# Patient Record
Sex: Female | Born: 1995 | Race: White | Hispanic: No | Marital: Single | Smoking: Current every day smoker
Health system: Southern US, Community
[De-identification: ages and names within clinical notes are randomized; demographics above are authoritative.]

---
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
## 2017-01-29 ENCOUNTER — Encounter (HOSPITAL_COMMUNITY): Payer: Self-pay

## 2017-01-29 DIAGNOSIS — R109 Unspecified abdominal pain: Secondary | ICD-10-CM | POA: Diagnosis present

## 2017-01-29 DIAGNOSIS — Z7722 Contact with and (suspected) exposure to environmental tobacco smoke (acute) (chronic): Secondary | ICD-10-CM | POA: Insufficient documentation

## 2017-01-29 DIAGNOSIS — R1031 Right lower quadrant pain: Secondary | ICD-10-CM | POA: Diagnosis not present

## 2017-01-29 LAB — CBC
HEMATOCRIT: 42.5 % (ref 36.0–46.0)
HEMOGLOBIN: 14.6 g/dL (ref 12.0–15.0)
MCH: 32.2 pg (ref 26.0–34.0)
MCHC: 34.4 g/dL (ref 30.0–36.0)
MCV: 93.8 fL (ref 78.0–100.0)
Platelets: 388 10*3/uL (ref 150–400)
RBC: 4.53 MIL/uL (ref 3.87–5.11)
RDW: 12.5 % (ref 11.5–15.5)
WBC: 11.3 10*3/uL — ABNORMAL HIGH (ref 4.0–10.5)

## 2017-01-29 LAB — COMPREHENSIVE METABOLIC PANEL
ALBUMIN: 4 g/dL (ref 3.5–5.0)
ALT: 30 U/L (ref 14–54)
AST: 28 U/L (ref 15–41)
Alkaline Phosphatase: 69 U/L (ref 38–126)
Anion gap: 8 (ref 5–15)
BUN: 7 mg/dL (ref 6–20)
CO2: 21 mmol/L — ABNORMAL LOW (ref 22–32)
Calcium: 8.9 mg/dL (ref 8.9–10.3)
Chloride: 105 mmol/L (ref 101–111)
Creatinine, Ser: 0.69 mg/dL (ref 0.44–1.00)
GFR calc Af Amer: >60 mL/min (ref >60)
GFR calc non Af Amer: >60 mL/min (ref >60)
Glucose, Bld: 96 mg/dL (ref 65–99)
POTASSIUM: 3.5 mmol/L (ref 3.5–5.1)
Sodium: 134 mmol/L — ABNORMAL LOW (ref 135–145)
Total Bilirubin: 0.6 mg/dL (ref 0.3–1.2)
Total Protein: 6.9 g/dL (ref 6.5–8.1)

## 2017-01-29 LAB — POC URINE PREG, ED: Preg Test, Ur: NEGATIVE

## 2017-01-29 LAB — URINALYSIS, ROUTINE W REFLEX MICROSCOPIC
BILIRUBIN URINE: NEGATIVE
GLUCOSE, UA: NEGATIVE mg/dL
HGB URINE DIPSTICK: NEGATIVE
KETONES UR: NEGATIVE mg/dL
Leukocytes, UA: NEGATIVE
Nitrite: NEGATIVE
PH: 7 (ref 5.0–8.0)
Protein, ur: NEGATIVE mg/dL
SPECIFIC GRAVITY, URINE: 1.01 (ref 1.005–1.030)

## 2017-01-29 LAB — LIPASE, BLOOD: Lipase: 30 U/L (ref 11–51)

## 2017-01-29 NOTE — ED Triage Notes (Signed)
Pt complaining of R lower abdominal pain x 1 day. Pt denies any urinary symptoms. Pt denies any vaginal bleeding or discharge. Pt denies any N/V/D. Pt states LBM today. Pt complaining of constipation.

## 2017-01-30 ENCOUNTER — Emergency Department (HOSPITAL_COMMUNITY)
Admission: EM | Admit: 2017-01-30 | Discharge: 2017-01-30 | Disposition: A | Discharge status: Home / Self Care | Payer: Medicaid Other | Attending: Emergency Medicine | Admitting: Emergency Medicine

## 2017-01-30 DIAGNOSIS — R1031 Right lower quadrant pain: Secondary | ICD-10-CM

## 2017-01-30 NOTE — ED Notes (Signed)
C/o a headache 

## 2017-01-30 NOTE — ED Provider Notes (Signed)
By signing my name below, I, Clarisse GougeXavier Herndon, attest that this documentation has been prepared under the direction and in the presence of Royal Beirne, Layla MawKristen N, DO. Electronically signed, Clarisse GougeXavier Herndon, ED Scribe. 01/30/17. 2:17 AM.   TIME SEEN: 2:13 AM   CHIEF COMPLAINT: Abdominal Pain  HPI:  Erika Clarke is an otherwise healthy 21 y.o. female presenting to the Emergency Department concerning RLQ pain onset within the last 24 hours. Associated dizziness and headache, chills, nausea, noted; pt states all symptoms but headache have subsided. Triage reports constipation. Last BM reportedly yesterday. She describes all over throbbing pain with headache. Has had similar headaches before. No sudden onset, thunderclap headache. No medications PTA. LNMP "2 weeks ago". H/o ovarian cysts noted; pt states he pain feels different from that. No h/o abdominal surgeries noted. Pt sexually active; no h/o pregnancies or STD's. No dysuria, hematuria, urgency, urinary frequency, vaginal bleeding or discharge, vomiting or diarrhea noted. No other complaints at this time.   ROS: See HPI Constitutional: no fever  Eyes: no drainage  ENT: no runny nose   Cardiovascular:  no chest pain  Resp: no SOB  GI: no vomiting GU: no dysuria Integumentary: no rash  Allergy: no hives  Musculoskeletal: no leg swelling  Neurological: no slurred speech ROS otherwise negative  PAST MEDICAL HISTORY/PAST SURGICAL HISTORY:  History reviewed. No pertinent past medical history.  MEDICATIONS:  Prior to Admission medications   Medication Sig Start Date End Date Taking? Authorizing Provider  amoxicillin (AMOXIL) 400 MG/5ML suspension Take 6.3 mLs (500 mg total) by mouth 2 (two) times daily. 04/28/15   Ozella RocksMerrell, David J, MD    ALLERGIES:  No Known Allergies  SOCIAL HISTORY:  Social History  Substance Use Topics  . Smoking status: Passive Smoke Exposure - Never Smoker  . Smokeless tobacco: Never Used  . Alcohol use No     FAMILY HISTORY: History reviewed. No pertinent family history.  EXAM: BP 116/74   Pulse 98   Temp 98.5 F (36.9 C) (Oral)   Resp 18   LMP 01/15/2017 (Approximate)   SpO2 100%  CONSTITUTIONAL: Alert and oriented and responds appropriately to questions. Well-appearing; well-nourished HEAD: Normocephalic EYES: Conjunctivae clear, pupils appear equal, EOMI ENT: normal nose; moist mucous membranes NECK: Supple, no meningismus, no nuchal rigidity, no LAD  CARD: RRR; S1 and S2 appreciated; no murmurs, no clicks, no rubs, no gallops RESP: Normal chest excursion without splinting or tachypnea; breath sounds clear and equal bilaterally; no wheezes, no rhonchi, no rales, no hypoxia or respiratory distress, speaking full sentences ABD/GI: Normal bowel sounds; non-distended; soft, non-tender, no rebound, no guarding, no peritoneal signs, no hepatosplenomegaly BACK:  The back appears normal and is non-tender to palpation, there is no CVA tenderness EXT: Normal ROM in all joints; non-tender to palpation; no edema; normal capillary refill; no cyanosis, no calf tenderness or swelling    SKIN: Normal color for age and race; warm; no rash NEURO: Moves all extremities equally, normal sensation diffusely, cranial nerves II through XII intact, normal gait, normal speech PSYCH: The patient's mood and manner are appropriate. Grooming and personal hygiene are appropriate.  MEDICAL DECISION MAKING: Patient here with complaints of abdominal pain that has now completely resolved. She is actually no tenderness at McBurney's point, no tenderness in the pelvic area, negative Murphy sign. She states her pain and dizziness have resolved and all she is having is a mild headache at this time. Have offered Tylenol, ibuprofen which she declined stating she will take it  when she gets home. I do not think this is an intracranial hemorrhage or meningitis. Discussed with her that I doubt this is appendicitis, ovarian torsion,  PID, TOA or significant ovarian cyst given she is completely pain-free at this time. Her labs and urine are unremarkable and she is not pregnant. I do not feel she needs emergent imaging and she is comfortable with this plan. We did discuss if pain returns or she begins having fevers, vomiting and cannot stop, significant vaginal bleeding that she needs to return to the hospital. Patient and mother at bedside are comfortable with this plan. She has outpatient provider for follow-up.  At this time, I do not feel there is any life-threatening condition present. I have reviewed and discussed all results (EKG, imaging, lab, urine as appropriate) and exam findings with patient/family. I have reviewed nursing notes and appropriate previous records.  I feel the patient is safe to be discharged home without further emergent workup and can continue workup as an outpatient as needed. Discussed usual and customary return precautions. Patient/family verbalize understanding and are comfortable with this plan.  Outpatient follow-up has been provided if needed. All questions have been answered.   I personally performed the services described in this documentation, which was scribed in my presence. The recorded information has been reviewed and is accurate.      Puja Caffey, Layla Maw, DO 01/30/17 [phone]

## 2017-01-30 NOTE — Discharge Instructions (Signed)
You may alternate Tylenol 1000 mg every 6 hours as needed for pain and Ibuprofen 800 mg every 8 hours as needed for pain.  Please take Ibuprofen with food. ° °

## 2017-03-17 ENCOUNTER — Encounter (HOSPITAL_COMMUNITY): Payer: Self-pay | Admitting: Emergency Medicine

## 2017-03-17 ENCOUNTER — Emergency Department (HOSPITAL_COMMUNITY): Payer: Medicaid Other

## 2017-03-17 DIAGNOSIS — S52125A Nondisplaced fracture of head of left radius, initial encounter for closed fracture: Secondary | ICD-10-CM | POA: Diagnosis not present

## 2017-03-17 DIAGNOSIS — Y999 Unspecified external cause status: Secondary | ICD-10-CM | POA: Diagnosis not present

## 2017-03-17 DIAGNOSIS — Y929 Unspecified place or not applicable: Secondary | ICD-10-CM | POA: Diagnosis not present

## 2017-03-17 DIAGNOSIS — S52135A Nondisplaced fracture of neck of left radius, initial encounter for closed fracture: Secondary | ICD-10-CM | POA: Diagnosis not present

## 2017-03-17 DIAGNOSIS — F17210 Nicotine dependence, cigarettes, uncomplicated: Secondary | ICD-10-CM | POA: Insufficient documentation

## 2017-03-17 DIAGNOSIS — Y9351 Activity, roller skating (inline) and skateboarding: Secondary | ICD-10-CM | POA: Diagnosis not present

## 2017-03-17 DIAGNOSIS — S59802A Other specified injuries of left elbow, initial encounter: Secondary | ICD-10-CM | POA: Diagnosis present

## 2017-03-17 NOTE — ED Triage Notes (Signed)
Pt reports she was riding a skateboard, fell off landing on L elbow, mild swelling noted.

## 2017-03-18 ENCOUNTER — Emergency Department (HOSPITAL_COMMUNITY)
Admission: EM | Admit: 2017-03-18 | Discharge: 2017-03-18 | Disposition: A | Discharge status: Home / Self Care | Payer: Medicaid Other | Attending: Emergency Medicine | Admitting: Emergency Medicine

## 2017-03-18 DIAGNOSIS — S52125A Nondisplaced fracture of head of left radius, initial encounter for closed fracture: Secondary | ICD-10-CM

## 2017-03-18 DIAGNOSIS — S52135A Nondisplaced fracture of neck of left radius, initial encounter for closed fracture: Secondary | ICD-10-CM

## 2017-03-18 MED ORDER — OXYCODONE-ACETAMINOPHEN 5-325 MG PO TABS: 1.0000 | ORAL_TABLET | Freq: Once | ORAL | Status: DC

## 2017-03-18 MED ORDER — IBUPROFEN 100 MG/5ML PO SUSP
600.0000 mg | Freq: Once | ORAL | Status: AC
  Administered 2017-03-18: 600 mg via ORAL
  Filled 2017-03-18: qty 30

## 2017-03-18 MED ORDER — IBUPROFEN 100 MG/5ML PO SUSP: 400.0000 mg | Freq: Four times a day (QID) | ORAL | 0 refills | Status: DC | PRN

## 2017-03-18 MED ORDER — MORPHINE SULFATE (PF) 4 MG/ML IV SOLN
4.0000 mg | Freq: Once | INTRAVENOUS | Status: AC
  Administered 2017-03-18: 4 mg via INTRAMUSCULAR
  Filled 2017-03-18: qty 1

## 2017-03-18 NOTE — ED Provider Notes (Signed)
[facility]-EMERGENCY DEPT Provider Note   CSN: [id number] Arrival date & time: 03/17/17  2142     History   Chief Complaint No chief complaint on file.   HPI Erika Clarke is a 21 y.o. female.  HPI  21 y.o. female, presents to the Emergency Department today due to left elbow pain. Pt states she fell off skateboard PTA around 9pm. No head trauma or LOC. Landed on left elbow directly. Notes pain with ROM. Rates pain 10/10. Throbbing. No meds PTA. Able to move fingers. Denies numbness/tingling. No other symptoms noted.    History reviewed. No pertinent past medical history.  There are no active problems to display for this patient.   History reviewed. No pertinent surgical history.  OB History    No data available       Home Medications    Prior to Admission medications   Medication Sig Start Date End Date Taking? Authorizing Provider  amoxicillin (AMOXIL) 400 MG/5ML suspension Take 6.3 mLs (500 mg total) by mouth 2 (two) times daily. 04/28/15   Ozella Rocks, MD    Family History No family history on file.  Social History Social History  Substance Use Topics  . Smoking status: Current Every Day Smoker    Packs/day: 1.00  . Smokeless tobacco: Never Used  . Alcohol use No     Allergies   Salmon [fish allergy]   Review of Systems Review of Systems ROS reviewed and all are negative for acute change except as noted in the HPI.  Physical Exam Updated Vital Signs BP 128/87 (BP Location: Right Arm)   Pulse (!) 113   Temp 98.6 F (37 C) (Oral)   Resp 20   Ht 5\' 2"  (1.575 m)   Wt 74.8 kg (165 lb)   LMP 03/03/2017 (Approximate)   SpO2 100%   BMI 30.18 kg/m   Physical Exam  Constitutional: She is oriented to person, place, and time. Vital signs are normal. She appears well-developed and well-nourished.  HENT:  Head: Normocephalic.  Right Ear: Hearing normal.  Left Ear: Hearing normal.  Eyes: Pupils are equal, round, and reactive to light. Conjunctivae  and EOM are normal.  Cardiovascular: Regular rhythm.  Tachycardia present.   Pulmonary/Chest: Effort normal.  Musculoskeletal:  TTP around left elbow. Mild swelling. Limited ROM due to pain. Distal pulses appreciated. NVI.   Neurological: She is alert and oriented to person, place, and time.  Skin: Skin is warm and dry.  Psychiatric: She has a normal mood and affect. Her speech is normal and behavior is normal. Thought content normal.     ED Treatments / Results  Labs (all labs ordered are listed, but only abnormal results are displayed) Labs Reviewed - No data to display  EKG  EKG Interpretation None       Radiology Dg Elbow Complete Left  Result Date: 03/17/2017 CLINICAL DATA:  Elbow pain after fall from skateboard. EXAM: LEFT ELBOW - COMPLETE 3+ VIEW COMPARISON:  None. FINDINGS: There are acute, closed nondisplaced fractures of the radial head and neck with associated joint effusion. No joint dislocation is noted. No significant fracture displacement or depression. IMPRESSION: Acute, closed nondisplaced fractures of the radial head and neck with associated joint effusion. No significant depression or displacement is identified. Electronically Signed   By: Tollie Eth M.D.   On: 03/17/2017 22:53    Procedures .Splint Application Date/Time: 03/18/2017 12:25 AM Performed by: Audry Pili Authorized by: Audry Pili   Consent:    Consent obtained:  Verbal   Consent given by:  Patient   Risks discussed:  Discoloration, numbness, pain and swelling   Alternatives discussed:  No treatment Pre-procedure details:    Sensation:  Normal Procedure details:    Laterality:  Left   Location:  Elbow   Elbow:  L elbow   Splint type:  Long arm Post-procedure details:    Pain:  Improved   Sensation:  Normal   Patient tolerance of procedure:  Tolerated well, no immediate complications   (including critical care time)  Medications Ordered in ED Medications  morphine 4 MG/ML injection  4 mg (not administered)     Initial Impression / Assessment and Plan / ED Course  I have reviewed the triage vital signs and the nursing notes.  Pertinent labs & imaging results that were available during my care of the patient were reviewed by me and considered in my medical decision making (see chart for details).  Final Clinical Impressions(s) / ED Diagnoses   {I have reviewed and evaluated the relevant imaging studies.  {I have reviewed the relevant previous healthcare records.  {I obtained HPI from historian.   ED Course:  Assessment: Patient X-Ray shows radiad head/neck fracture. Non displaced. Closed.  Pt advised to follow up with orthopedics. Patient given long arm splint and sling while in ED. Cap refill intact. NVI post application. Conservative therapy recommended and discussed. Patient will be discharged home & is agreeable with above plan. Returns precautions discussed. Pt appears safe for discharge.  Disposition/Plan:  DC Home Additional Verbal discharge instructions given and discussed with patient.  Pt Instructed to f/u with ortho Hand in the next week for evaluation and treatment of symptoms. Return precautions given Pt acknowledges and agrees with plan  Supervising Physician Azalia Bilisampos, Kevin, MD  Final diagnoses:  Closed nondisplaced fracture of neck of left radius, initial encounter  Closed nondisplaced fracture of head of left radius, initial encounter    New Prescriptions New Prescriptions   No medications on file     Wilber BihariMohr, Haylyn Halberg, PA-C 03/18/17 Nolon Bussing0025    Campos, Kevin, MD 03/18/17 [phone]

## 2017-03-18 NOTE — Discharge Instructions (Signed)
Please read and follow all provided instructions.  Your diagnoses today include:  1. Closed nondisplaced fracture of neck of left radius, initial encounter   2. Closed nondisplaced fracture of head of left radius, initial encounter     Tests performed today include: Vital signs. See below for your results today.   Medications prescribed:  Take as prescribed   Home care instructions:  Follow any educational materials contained in this packet.  Follow-up instructions: Please follow-up with orthopedics for further evaluation of symptoms and treatment   Return instructions:  Please return to the Emergency Department if you do not get better, if you get worse, or new symptoms OR  - Fever (temperature greater than 101.53F)  - Bleeding that does not stop with holding pressure to the area    -Severe pain (please note that you may be more sore the day after your accident)  - Chest Pain  - Difficulty breathing  - Severe nausea or vomiting  - Inability to tolerate food and liquids  - Passing out  - Skin becoming red around your wounds  - Change in mental status (confusion or lethargy)  - New numbness or weakness    Please return if you have any other emergent concerns.  Additional Information:  Your vital signs today were: BP 128/87 (BP Location: Right Arm)    Pulse (!) 113    Temp 98.6 F (37 C) (Oral)    Resp 20    Ht 5\' 2"  (1.575 m)    Wt 74.8 kg (165 lb)    LMP 03/03/2017 (Approximate)    SpO2 100%    BMI 30.18 kg/m  If your blood pressure (BP) was elevated above 135/85 this visit, please have this repeated by your doctor within one month. ---------------

## 2017-03-23 DIAGNOSIS — X58XXXA Exposure to other specified factors, initial encounter: Secondary | ICD-10-CM | POA: Diagnosis not present

## 2017-03-23 DIAGNOSIS — Y9351 Activity, roller skating (inline) and skateboarding: Secondary | ICD-10-CM | POA: Insufficient documentation

## 2017-03-23 DIAGNOSIS — F17200 Nicotine dependence, unspecified, uncomplicated: Secondary | ICD-10-CM | POA: Diagnosis not present

## 2017-03-23 DIAGNOSIS — Y998 Other external cause status: Secondary | ICD-10-CM | POA: Diagnosis not present

## 2017-03-23 DIAGNOSIS — L03116 Cellulitis of left lower limb: Secondary | ICD-10-CM | POA: Diagnosis not present

## 2017-03-23 DIAGNOSIS — Y929 Unspecified place or not applicable: Secondary | ICD-10-CM | POA: Insufficient documentation

## 2017-03-23 DIAGNOSIS — S99922A Unspecified injury of left foot, initial encounter: Secondary | ICD-10-CM | POA: Diagnosis present

## 2017-03-23 NOTE — ED Triage Notes (Signed)
Pt c/o L foot pain/swelling/redness and drainage post injury 7 days ago. Red streak noted to dorsal foot.

## 2017-03-24 ENCOUNTER — Emergency Department (HOSPITAL_COMMUNITY): Payer: Medicaid Other

## 2017-03-24 ENCOUNTER — Encounter (HOSPITAL_COMMUNITY): Payer: Self-pay | Admitting: Emergency Medicine

## 2017-03-24 ENCOUNTER — Emergency Department (HOSPITAL_COMMUNITY)
Admission: EM | Admit: 2017-03-24 | Discharge: 2017-03-24 | Disposition: A | Discharge status: Home / Self Care | Payer: Medicaid Other | Attending: Emergency Medicine | Admitting: Emergency Medicine

## 2017-03-24 DIAGNOSIS — L03119 Cellulitis of unspecified part of limb: Secondary | ICD-10-CM

## 2017-03-24 LAB — COMPREHENSIVE METABOLIC PANEL
ALK PHOS: 85 U/L (ref 38–126)
ALT: 38 U/L (ref 14–54)
ANION GAP: 11 (ref 5–15)
AST: 36 U/L (ref 15–41)
Albumin: 4.1 g/dL (ref 3.5–5.0)
BILIRUBIN TOTAL: 0.7 mg/dL (ref 0.3–1.2)
BUN: 8 mg/dL (ref 6–20)
CALCIUM: 9.4 mg/dL (ref 8.9–10.3)
CO2: 22 mmol/L (ref 22–32)
Chloride: 105 mmol/L (ref 101–111)
Creatinine, Ser: 0.63 mg/dL (ref 0.44–1.00)
GFR calc non Af Amer: >60 mL/min (ref >60)
GLUCOSE: 87 mg/dL (ref 65–99)
Potassium: 3.5 mmol/L (ref 3.5–5.1)
Sodium: 138 mmol/L (ref 135–145)
TOTAL PROTEIN: 7.3 g/dL (ref 6.5–8.1)

## 2017-03-24 LAB — CBC WITH DIFFERENTIAL/PLATELET
BASOS ABS: 0.1 10*3/uL (ref 0.0–0.1)
Basophils Relative: 1 %
EOS ABS: 0.7 10*3/uL (ref 0.0–0.7)
Eosinophils Relative: 5 %
HCT: 44 % (ref 36.0–46.0)
Hemoglobin: 15.7 g/dL — ABNORMAL HIGH (ref 12.0–15.0)
LYMPHS ABS: 4.6 10*3/uL — AB (ref 0.7–4.0)
Lymphocytes Relative: 33 %
MCH: 32.8 pg (ref 26.0–34.0)
MCHC: 35.7 g/dL (ref 30.0–36.0)
MCV: 92.1 fL (ref 78.0–100.0)
Monocytes Absolute: 0.8 10*3/uL (ref 0.1–1.0)
Monocytes Relative: 6 %
NEUTROS ABS: 7.7 10*3/uL (ref 1.7–7.7)
Neutrophils Relative %: 55 %
Platelets: 407 10*3/uL — ABNORMAL HIGH (ref 150–400)
RBC: 4.78 MIL/uL (ref 3.87–5.11)
RDW: 12.2 % (ref 11.5–15.5)
WBC: 13.9 10*3/uL — ABNORMAL HIGH (ref 4.0–10.5)

## 2017-03-24 LAB — I-STAT CG4 LACTIC ACID, ED: Lactic Acid, Venous: 1.57 mmol/L (ref 0.5–1.9)

## 2017-03-24 MED ORDER — IBUPROFEN 100 MG/5ML PO SUSP: 400.0000 mg | Freq: Four times a day (QID) | ORAL | 0 refills | Status: AC | PRN

## 2017-03-24 MED ORDER — MUPIROCIN CALCIUM 2 % EX CREA
TOPICAL_CREAM | Freq: Two times a day (BID) | CUTANEOUS | Status: DC
  Administered 2017-03-24: 04:00:00 via TOPICAL
  Filled 2017-03-24: qty 15

## 2017-03-24 MED ORDER — CEPHALEXIN 250 MG/5ML PO SUSR
500.0000 mg | Freq: Three times a day (TID) | ORAL | Status: DC
  Administered 2017-03-24: 500 mg via ORAL
  Filled 2017-03-24: qty 10

## 2017-03-24 MED ORDER — CEPHALEXIN 250 MG/5ML PO SUSR: 500.0000 mg | Freq: Three times a day (TID) | ORAL | 0 refills | Status: AC

## 2017-03-24 NOTE — Discharge Instructions (Signed)
Wash the area with 7 water daily, apply a small amount of Bactroban ointment to the .   cover also been given a prescription for cephalexin.  Please take this as directed 3 times a day until all medicine has been taken.  Watch the area for signs of worsening infection Return if you develop fever, increased pain or swelling, increased drainage

## 2017-03-24 NOTE — ED Provider Notes (Signed)
[facility] Provider Note   CSN: [id number] Arrival date & time: 03/23/17  2348     History   Chief Complaint Chief Complaint  Patient presents with  . Foot Injury    HPI Erika Clarke is a 21 y.o. female.  This is an 21 year old female who was involved in a skateboarding accident on August 10th where she had a fractured arm, followed with orthopedics.  She also had a number of abrasions to the lateral aspect of her left foot that now appear to be infected.  She's been applying Neosporin ointment daily.      History reviewed. No pertinent past medical history.  There are no active problems to display for this patient.   History reviewed. No pertinent surgical history.  OB History    No data available       Home Medications    Prior to Admission medications   Medication Sig Start Date End Date Taking? Authorizing Provider  amoxicillin (AMOXIL) 400 MG/5ML suspension Take 6.3 mLs (500 mg total) by mouth 2 (two) times daily. 04/28/15   Ozella RocksMerrell, David J, MD  cephALEXin (KEFLEX) 250 MG/5ML suspension Take 10 mLs (500 mg total) by mouth 3 (three) times daily. 03/24/17 03/31/17  Earley FavorSchulz, Donna Snooks, NP  ibuprofen (ADVIL,MOTRIN) 100 MG/5ML suspension Take 20 mLs (400 mg total) by mouth every 6 (six) hours as needed. 03/24/17   Earley FavorSchulz, Jayci Ellefson, NP    Family History No family history on file.  Social History Social History  Substance Use Topics  . Smoking status: Current Every Day Smoker    Packs/day: 1.00  . Smokeless tobacco: Never Used  . Alcohol use No     Allergies   Salmon [fish allergy]   Review of Systems Review of Systems  Constitutional: Negative for fever.  Musculoskeletal: Negative for joint swelling.  Skin: Positive for wound.  All other systems reviewed and are negative.    Physical Exam Updated Vital Signs BP 136/66 (BP Location: Right Arm)   Pulse (!) 111   Temp 97.7 F (36.5 C) (Oral)   Resp 16   LMP 03/03/2017 (Approximate)   SpO2  100%   Physical Exam  Constitutional: She appears well-developed and well-nourished.  HENT:  Head: Normocephalic.  Eyes: Pupils are equal, round, and reactive to light.  Neck: Normal range of motion.  Cardiovascular: Normal rate.   Pulmonary/Chest: Effort normal.  Neurological: She is alert.  Skin: There is erythema.     Psychiatric: She has a normal mood and affect.  Nursing note and vitals reviewed.    ED Treatments / Results  Labs (all labs ordered are listed, but only abnormal results are displayed) Labs Reviewed  CBC WITH DIFFERENTIAL/PLATELET - Abnormal; Notable for the following:       Result Value   WBC 13.9 (*)    Hemoglobin 15.7 (*)    Platelets 407 (*)    Lymphs Abs 4.6 (*)    All other components within normal limits  COMPREHENSIVE METABOLIC PANEL  I-STAT CG4 LACTIC ACID, ED  I-STAT CG4 LACTIC ACID, ED    EKG  EKG Interpretation None       Radiology Dg Foot Complete Left  Result Date: 03/24/2017 CLINICAL DATA:  Acute onset of left foot swelling, pain and erythema. Drainage at the left foot. Initial encounter. EXAM: LEFT FOOT - COMPLETE 3+ VIEW COMPARISON:  None. FINDINGS: There is no evidence of fracture or dislocation. The joint spaces are preserved. There is no evidence of talar subluxation; the subtalar joint  is unremarkable in appearance. Known soft tissue disruption is not well characterized on radiograph. No radiopaque foreign bodies are seen. IMPRESSION: No evidence of fracture or dislocation. No radiopaque foreign bodies seen. Electronically Signed   By: Roanna Raider M.D.   On: 03/24/2017 01:10    Procedures Procedures (including critical care time)  Medications Ordered in ED Medications  mupirocin cream (BACTROBAN) 2 % (not administered)  cephALEXin (KEFLEX) 250 MG/5ML suspension 500 mg (not administered)     Initial Impression / Assessment and Plan / ED Course  I have reviewed the triage vital signs and the nursing notes.  Pertinent  labs & imaging results that were available during my care of the patient were reviewed by me and considered in my medical decision making (see chart for details).      Changed from Neosporin to Bactroban as well as giving oral Keflex.  Patient is to wash the area with soap and water daily  Final Clinical Impressions(s) / ED Diagnoses   Final diagnoses:  Cellulitis of foot excluding toe    New Prescriptions New Prescriptions   CEPHALEXIN (KEFLEX) 250 MG/5ML SUSPENSION    Take 10 mLs (500 mg total) by mouth 3 (three) times daily.     Earley Favor, NP 03/24/17 0344    Earley Favor, NP 03/24/17 1914    Zadie Rhine, MD 03/25/17 [phone]

## 2018-01-26 IMAGING — DX DG FOOT COMPLETE 3+V*L*
3 series · 3 of 3 positions shown · non-contrast
Comparison: None.

CLINICAL DATA: Acute onset of left foot swelling, pain and
erythema. Drainage at the left foot. Initial encounter.

EXAM:
LEFT FOOT - COMPLETE 3+ VIEW

[foot ap]
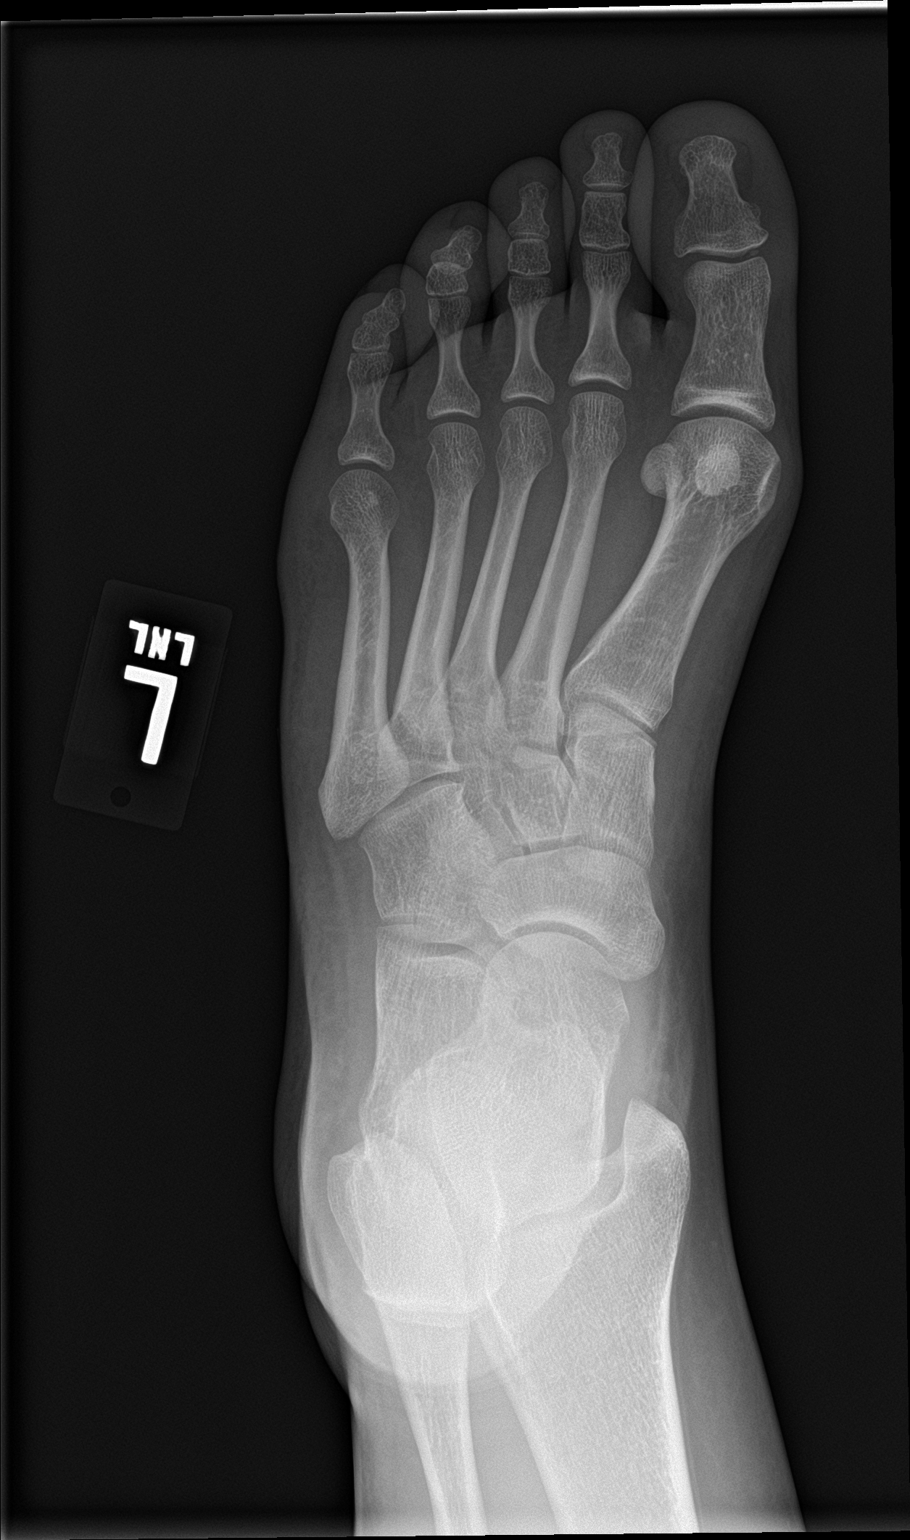

[foot obl]
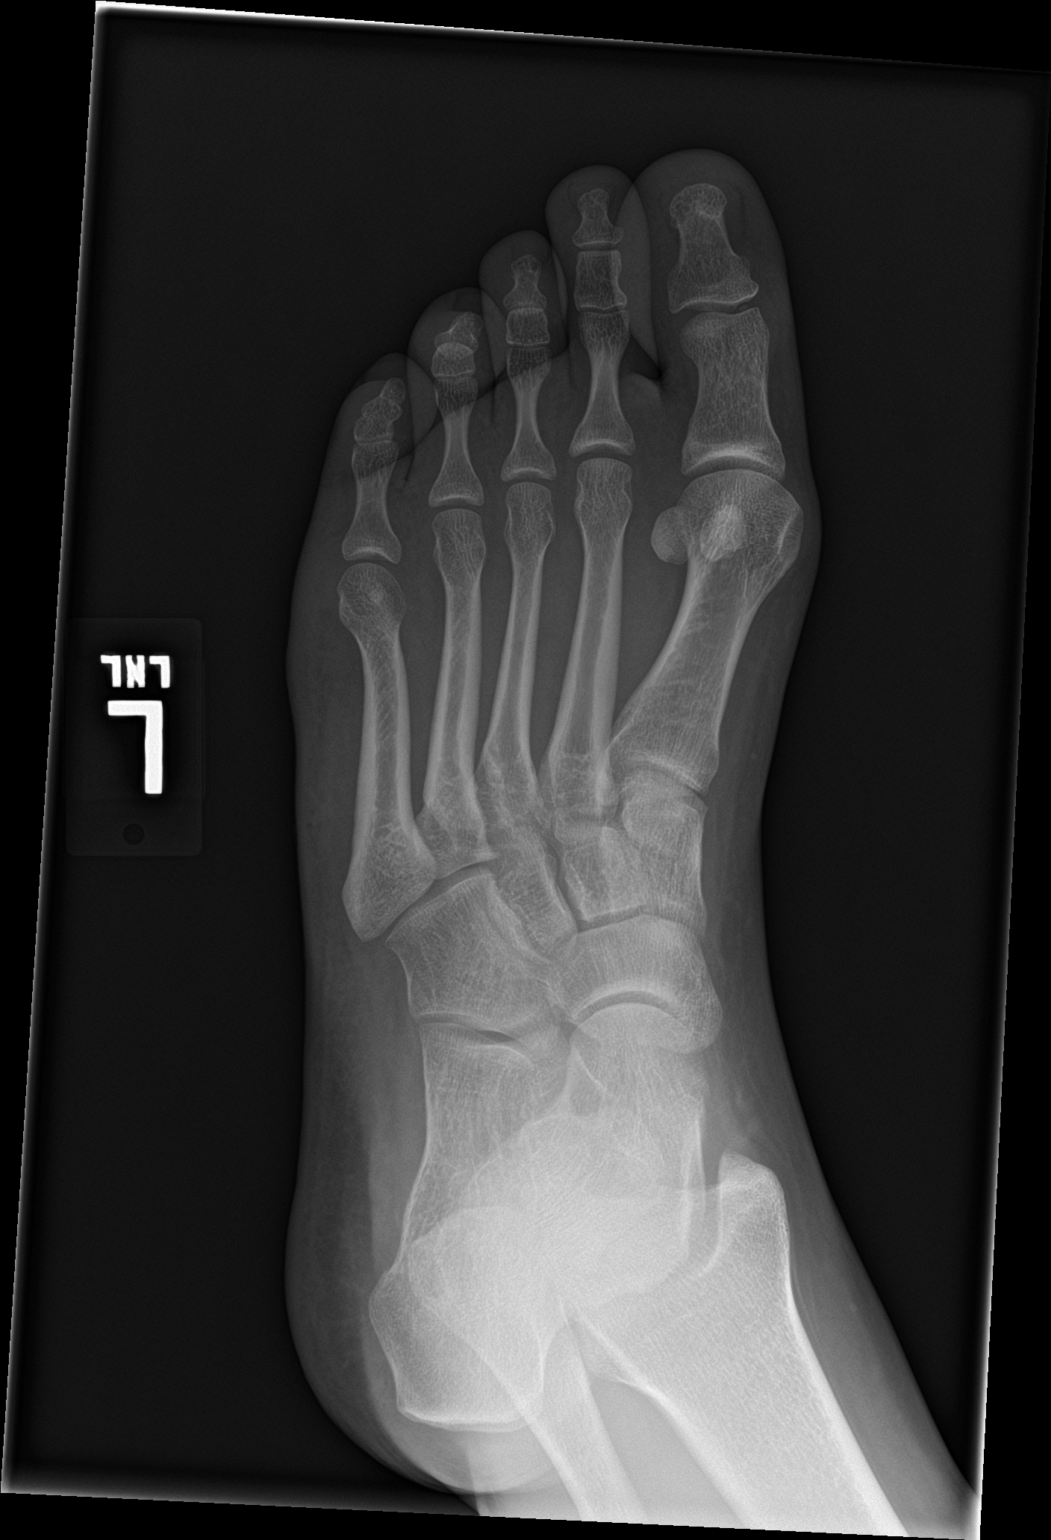

[foot lat]
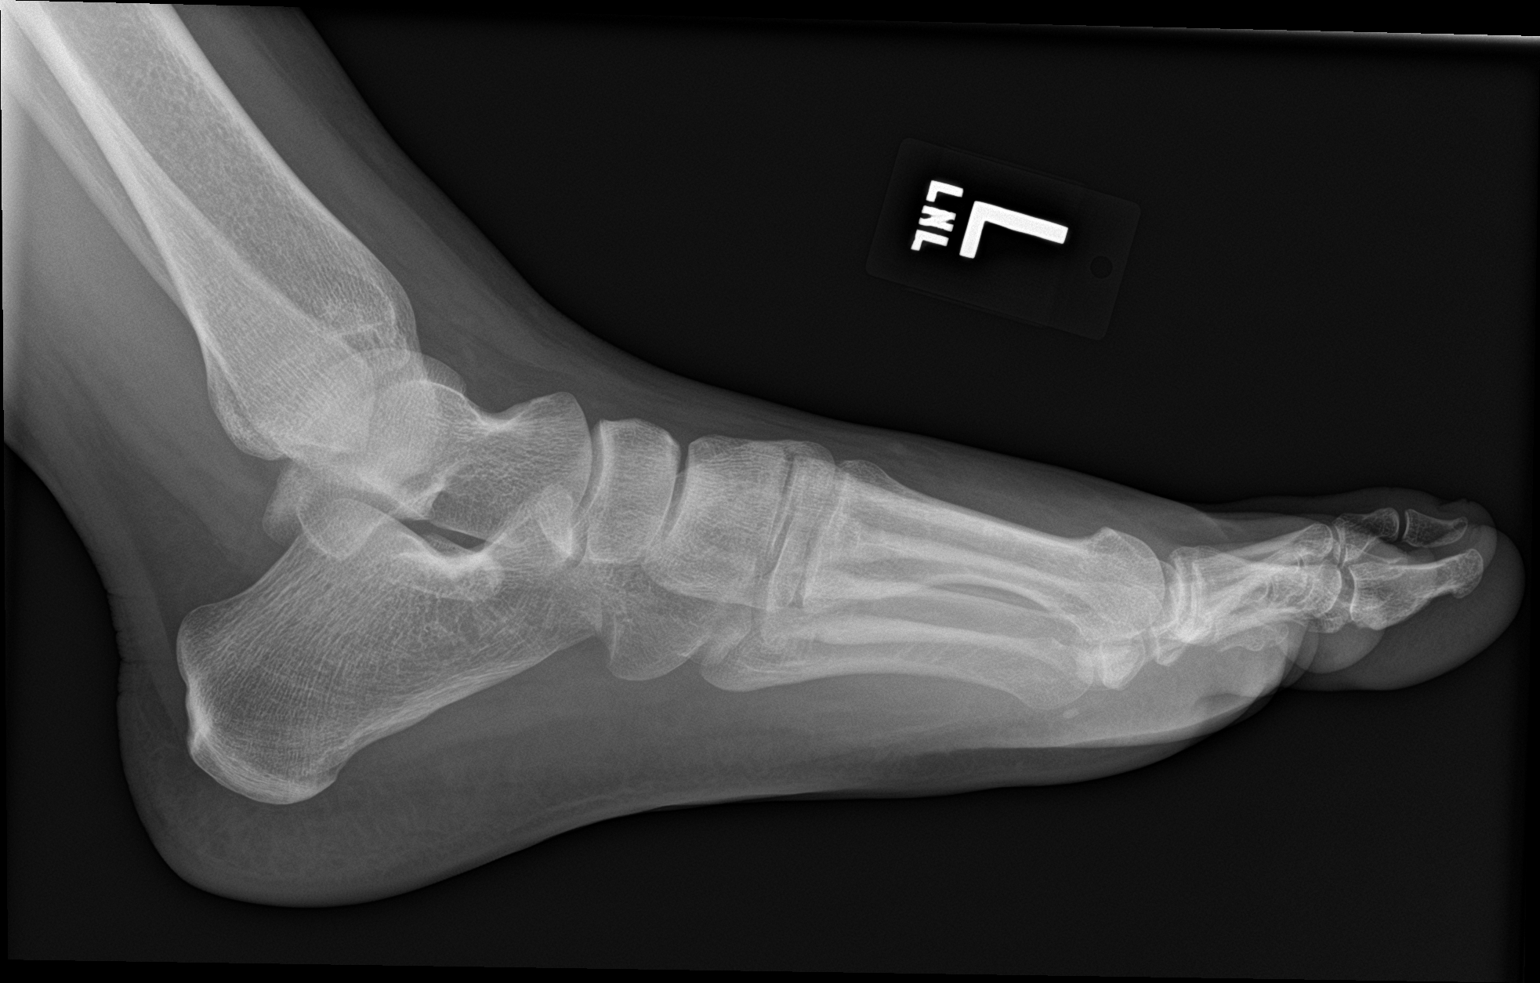

[3 of 3 positions shown; findings below may reference images not displayed]

FINDINGS: There is no evidence of fracture or dislocation. The joint spaces
are preserved. There is no evidence of talar subluxation; the
subtalar joint is unremarkable in appearance.

Known soft tissue disruption is not well characterized on
radiograph. No radiopaque foreign bodies are seen.
IMPRESSION: No evidence of fracture or dislocation. No radiopaque foreign bodies
seen.
